# Patient Record
Sex: Female | Born: 1965 | Race: White | Hispanic: No | Marital: Married | Smoking: Never smoker
Health system: Southern US, Community
[De-identification: ages and names within clinical notes are randomized; demographics above are authoritative.]

## PROBLEM LIST (undated history)

## (undated) DIAGNOSIS — E079 Disorder of thyroid, unspecified: Secondary | ICD-10-CM

---

## 1997-12-16 ENCOUNTER — Other Ambulatory Visit: Admission: RE | Admit: 1997-12-16 | Discharge: 1997-12-16 | Payer: Self-pay | Admitting: Obstetrics & Gynecology

## 1998-01-05 ENCOUNTER — Inpatient Hospital Stay (HOSPITAL_COMMUNITY): Admission: AD | Admit: 1998-01-05 | Discharge: 1998-01-07 | Payer: Self-pay | Admitting: Obstetrics and Gynecology

## 1998-07-16 ENCOUNTER — Other Ambulatory Visit: Admission: RE | Admit: 1998-07-16 | Discharge: 1998-07-16 | Payer: Self-pay | Admitting: Obstetrics and Gynecology

## 1999-07-28 ENCOUNTER — Other Ambulatory Visit: Admission: RE | Admit: 1999-07-28 | Discharge: 1999-07-28 | Payer: Self-pay | Admitting: Obstetrics and Gynecology

## 2000-07-28 ENCOUNTER — Other Ambulatory Visit: Admission: RE | Admit: 2000-07-28 | Discharge: 2000-07-28 | Payer: Self-pay | Admitting: Obstetrics and Gynecology

## 2001-08-03 ENCOUNTER — Other Ambulatory Visit: Admission: RE | Admit: 2001-08-03 | Discharge: 2001-08-03 | Payer: Self-pay | Admitting: Obstetrics & Gynecology

## 2002-12-26 ENCOUNTER — Other Ambulatory Visit: Admission: RE | Admit: 2002-12-26 | Discharge: 2002-12-26 | Payer: Self-pay | Admitting: Obstetrics and Gynecology

## 2003-12-31 ENCOUNTER — Other Ambulatory Visit: Admission: RE | Admit: 2003-12-31 | Discharge: 2003-12-31 | Payer: Self-pay | Admitting: Obstetrics and Gynecology

## 2005-01-20 ENCOUNTER — Other Ambulatory Visit: Admission: RE | Admit: 2005-01-20 | Discharge: 2005-01-20 | Payer: Self-pay | Admitting: Obstetrics and Gynecology

## 2005-09-26 ENCOUNTER — Ambulatory Visit: Payer: Self-pay | Admitting: Family Medicine

## 2005-09-30 ENCOUNTER — Ambulatory Visit: Payer: Self-pay | Admitting: Family Medicine

## 2006-05-17 ENCOUNTER — Ambulatory Visit: Payer: Self-pay | Admitting: Family Medicine

## 2006-06-07 ENCOUNTER — Ambulatory Visit (HOSPITAL_COMMUNITY): Admission: RE | Admit: 2006-06-07 | Discharge: 2006-06-08 | Payer: Self-pay | Admitting: Obstetrics and Gynecology

## 2015-05-24 ENCOUNTER — Emergency Department (INDEPENDENT_AMBULATORY_CARE_PROVIDER_SITE_OTHER): Payer: Worker's Compensation

## 2015-05-24 ENCOUNTER — Encounter (HOSPITAL_COMMUNITY): Payer: Self-pay | Admitting: *Deleted

## 2015-05-24 ENCOUNTER — Emergency Department (INDEPENDENT_AMBULATORY_CARE_PROVIDER_SITE_OTHER)
Admission: EM | Admit: 2015-05-24 | Discharge: 2015-05-24 | Disposition: A | Discharge status: Home / Self Care | Payer: Worker's Compensation | Source: Home / Self Care | Attending: Emergency Medicine | Admitting: Emergency Medicine

## 2015-05-24 DIAGNOSIS — S82891A Other fracture of right lower leg, initial encounter for closed fracture: Secondary | ICD-10-CM

## 2015-05-24 HISTORY — DX: Disorder of thyroid, unspecified: E07.9

## 2015-05-24 MED ORDER — HYDROCODONE-ACETAMINOPHEN 5-325 MG PO TABS: 1.0000 | ORAL_TABLET | Freq: Four times a day (QID) | ORAL | Status: AC | PRN

## 2015-05-24 NOTE — Progress Notes (Signed)
Orthopedic Tech Progress Note Patient Details:  Penny Shaw 08-24-1965 [id number]  Ortho Devices Type of Ortho Device: Ace wrap, Post (short leg) splint, Stirrup splint Ortho Device/Splint Location: RLE Ortho Device/Splint Interventions: Ordered, Application   Jennye MoccasinHughes, Penny Shaw 05/24/2015, 5:12 PM

## 2015-05-24 NOTE — Discharge Instructions (Signed)
You have a broken ankle. This may need to be fixed surgically. Please call [facility] first thing in the morning. They should have an appointment for you to see either Dr. Zachery DauerBarnes or Dr. Victorino DikeHewitt. Leave the splint on until you see the orthopedic doctor. No weightbearing. Keep your foot elevated as much as possible. Apply ice as often as you can. You can take 600 mg of ibuprofen every 6 hours as needed for pain. I have also provided a prescription for Vicodin to use as needed for pain.

## 2015-05-24 NOTE — ED Provider Notes (Signed)
CSN: [id number]     Arrival date & time 05/24/15  1439 History   First MD Initiated Contact with Patient 05/24/15 1551     Chief Complaint  Patient presents with  . Ankle Pain   (Consider location/radiation/quality/duration/timing/severity/associated sxs/prior Treatment) HPI She is a 49 year old woman here for evaluation of right ankle injury. She states she was attempting to milk a cow this afternoon around 1pm when they cow stepped on her lateral ankle. She states she was able to hobble up to the house, but has not been able to bear weight since. She reports pain and swelling around the ankle. Initially she had some discomfort over the dorsal foot, but this has resolved. She has taken some Advil and applied ice.  Past Medical History  Diagnosis Date  . Thyroid disease    History reviewed. No pertinent past surgical history. History reviewed. No pertinent family history. Social History  Substance Use Topics  . Smoking status: Never Smoker   . Smokeless tobacco: None  . Alcohol Use: Yes   OB History    No data available     Review of Systems As in history of present illness Allergies  Sulfur  Home Medications   Prior to Admission medications   Medication Sig Start Date End Date Taking? Authorizing Provider  Levothyroxine Sodium (SYNTHROID PO) Take by mouth.   Yes Historical Provider, MD  HYDROcodone-acetaminophen (NORCO) 5-325 MG tablet Take 1 tablet by mouth every 6 (six) hours as needed for moderate pain. 05/24/15   Charm RingsErin J Doyle Tegethoff, MD   Meds Ordered and Administered this Visit  Medications - No data to display  BP 152/90 mmHg  Pulse 73  Temp(Src) 98.8 F (37.1 C) (Oral)  SpO2 97% No data found.   Physical Exam  Constitutional: She is oriented to person, place, and time. She appears well-developed and well-nourished. No distress.  Cardiovascular: Normal rate.   Pulmonary/Chest: Effort normal.  Musculoskeletal:  Right ankle: No significant swelling on both the  lateral and medial ankle. She is tender over both malleoli. No swelling in the foot or forefoot tenderness. She has good range of motion of her toes. Testing of ankle range of motion limited due to pain. 2+ DP pulse.  Neurological: She is alert and oriented to person, place, and time.    ED Course  Procedures (including critical care time)  Labs Review Labs Reviewed - No data to display  Imaging Review No results found.     MDM   1. Closed right ankle fracture, initial encounter    Spoke with Dr. Shon BatonBrooks, [facility].  Will place patient in a posterior leg splint with stirrups. She has crutches. Discussed no weightbearing, elevation, and ice. Ibuprofen as needed for pain. Prescription given for Norco to use as needed. [facility] with Dr. Victorino DikeHewitt. She is to call tomorrow morning to get the time.    Charm RingsErin J Yaw Escoto, MD 05/24/15 [phone]

## 2015-05-24 NOTE — ED Notes (Signed)
Pt  Has  Her  Own  crutches

## 2015-05-24 NOTE — ED Notes (Signed)
Pt  Reports  She  Was  Kicked    By  A    Cow     On her  r  Ankle        She         Reports      That              She   Has    Pain      In  The  Affected   Ankle         Especially  On  Weight bearing

## 2018-06-21 DIAGNOSIS — Z6830 Body mass index (BMI) 30.0-30.9, adult: Secondary | ICD-10-CM | POA: Diagnosis not present

## 2018-06-21 DIAGNOSIS — Z01419 Encounter for gynecological examination (general) (routine) without abnormal findings: Secondary | ICD-10-CM | POA: Diagnosis not present

## 2018-06-21 DIAGNOSIS — Z1231 Encounter for screening mammogram for malignant neoplasm of breast: Secondary | ICD-10-CM | POA: Diagnosis not present

## 2019-02-06 ENCOUNTER — Other Ambulatory Visit: Payer: Self-pay

## 2019-02-06 DIAGNOSIS — Z20822 Contact with and (suspected) exposure to covid-19: Secondary | ICD-10-CM

## 2019-02-07 LAB — NOVEL CORONAVIRUS, NAA: SARS-CoV-2, NAA: DETECTED — AB

## 2019-04-18 DIAGNOSIS — E039 Hypothyroidism, unspecified: Secondary | ICD-10-CM | POA: Diagnosis not present

## 2019-04-18 DIAGNOSIS — E782 Mixed hyperlipidemia: Secondary | ICD-10-CM | POA: Diagnosis not present

## 2019-04-23 DIAGNOSIS — Z23 Encounter for immunization: Secondary | ICD-10-CM | POA: Diagnosis not present

## 2019-04-23 DIAGNOSIS — Z0000 Encounter for general adult medical examination without abnormal findings: Secondary | ICD-10-CM | POA: Diagnosis not present

## 2019-04-23 DIAGNOSIS — E782 Mixed hyperlipidemia: Secondary | ICD-10-CM | POA: Diagnosis not present

## 2019-04-23 DIAGNOSIS — Z0001 Encounter for general adult medical examination with abnormal findings: Secondary | ICD-10-CM | POA: Diagnosis not present

## 2019-04-23 DIAGNOSIS — E039 Hypothyroidism, unspecified: Secondary | ICD-10-CM | POA: Diagnosis not present

## 2019-06-13 DIAGNOSIS — H9202 Otalgia, left ear: Secondary | ICD-10-CM | POA: Diagnosis not present

## 2019-06-13 DIAGNOSIS — H60392 Other infective otitis externa, left ear: Secondary | ICD-10-CM | POA: Diagnosis not present

## 2019-06-24 DIAGNOSIS — H9121 Sudden idiopathic hearing loss, right ear: Secondary | ICD-10-CM | POA: Diagnosis not present

## 2019-06-24 DIAGNOSIS — H9319 Tinnitus, unspecified ear: Secondary | ICD-10-CM | POA: Diagnosis not present

## 2019-06-24 DIAGNOSIS — H9311 Tinnitus, right ear: Secondary | ICD-10-CM | POA: Diagnosis not present

## 2019-06-24 DIAGNOSIS — R42 Dizziness and giddiness: Secondary | ICD-10-CM | POA: Diagnosis not present

## 2019-06-24 DIAGNOSIS — H6981 Other specified disorders of Eustachian tube, right ear: Secondary | ICD-10-CM | POA: Diagnosis not present

## 2019-07-08 ENCOUNTER — Other Ambulatory Visit: Payer: Self-pay | Admitting: Otolaryngology

## 2019-07-08 DIAGNOSIS — H6981 Other specified disorders of Eustachian tube, right ear: Secondary | ICD-10-CM | POA: Diagnosis not present

## 2019-07-08 DIAGNOSIS — H9041 Sensorineural hearing loss, unilateral, right ear, with unrestricted hearing on the contralateral side: Secondary | ICD-10-CM | POA: Diagnosis not present

## 2019-07-12 DIAGNOSIS — H9041 Sensorineural hearing loss, unilateral, right ear, with unrestricted hearing on the contralateral side: Secondary | ICD-10-CM | POA: Diagnosis not present

## 2019-07-12 DIAGNOSIS — Z135 Encounter for screening for eye and ear disorders: Secondary | ICD-10-CM | POA: Diagnosis not present

## 2019-07-16 ENCOUNTER — Ambulatory Visit: Payer: Self-pay

## 2019-08-19 DIAGNOSIS — Z01419 Encounter for gynecological examination (general) (routine) without abnormal findings: Secondary | ICD-10-CM | POA: Diagnosis not present

## 2019-08-19 DIAGNOSIS — Z1231 Encounter for screening mammogram for malignant neoplasm of breast: Secondary | ICD-10-CM | POA: Diagnosis not present

## 2019-08-19 DIAGNOSIS — Z124 Encounter for screening for malignant neoplasm of cervix: Secondary | ICD-10-CM | POA: Diagnosis not present

## 2019-08-19 DIAGNOSIS — Z6827 Body mass index (BMI) 27.0-27.9, adult: Secondary | ICD-10-CM | POA: Diagnosis not present

## 2020-01-09 DIAGNOSIS — H6981 Other specified disorders of Eustachian tube, right ear: Secondary | ICD-10-CM | POA: Diagnosis not present

## 2020-01-09 DIAGNOSIS — H9311 Tinnitus, right ear: Secondary | ICD-10-CM | POA: Diagnosis not present

## 2020-01-09 DIAGNOSIS — H6980 Other specified disorders of Eustachian tube, unspecified ear: Secondary | ICD-10-CM | POA: Diagnosis not present

## 2020-01-09 DIAGNOSIS — H9041 Sensorineural hearing loss, unilateral, right ear, with unrestricted hearing on the contralateral side: Secondary | ICD-10-CM | POA: Diagnosis not present

## 2020-01-09 DIAGNOSIS — J301 Allergic rhinitis due to pollen: Secondary | ICD-10-CM | POA: Diagnosis not present

## 2020-01-16 DIAGNOSIS — D1801 Hemangioma of skin and subcutaneous tissue: Secondary | ICD-10-CM | POA: Diagnosis not present

## 2020-01-16 DIAGNOSIS — D0461 Carcinoma in situ of skin of right upper limb, including shoulder: Secondary | ICD-10-CM | POA: Diagnosis not present

## 2020-01-16 DIAGNOSIS — L821 Other seborrheic keratosis: Secondary | ICD-10-CM | POA: Diagnosis not present

## 2020-01-16 DIAGNOSIS — L570 Actinic keratosis: Secondary | ICD-10-CM | POA: Diagnosis not present

## 2020-02-18 ENCOUNTER — Other Ambulatory Visit (HOSPITAL_COMMUNITY): Payer: Self-pay | Admitting: Internal Medicine

## 2020-02-18 ENCOUNTER — Ambulatory Visit (HOSPITAL_COMMUNITY)
Admission: RE | Admit: 2020-02-18 | Discharge: 2020-02-18 | Disposition: A | Discharge status: Home / Self Care | Payer: BC Managed Care – PPO | Source: Ambulatory Visit | Attending: Internal Medicine | Admitting: Internal Medicine

## 2020-02-18 ENCOUNTER — Other Ambulatory Visit: Payer: Self-pay

## 2020-02-18 DIAGNOSIS — M79605 Pain in left leg: Secondary | ICD-10-CM

## 2020-02-18 DIAGNOSIS — M79662 Pain in left lower leg: Secondary | ICD-10-CM | POA: Diagnosis not present

## 2020-02-26 DIAGNOSIS — R202 Paresthesia of skin: Secondary | ICD-10-CM | POA: Diagnosis not present

## 2020-02-26 DIAGNOSIS — L237 Allergic contact dermatitis due to plants, except food: Secondary | ICD-10-CM | POA: Diagnosis not present

## 2020-02-26 DIAGNOSIS — M25552 Pain in left hip: Secondary | ICD-10-CM | POA: Diagnosis not present

## 2020-03-02 DIAGNOSIS — M5442 Lumbago with sciatica, left side: Secondary | ICD-10-CM | POA: Diagnosis not present

## 2020-03-02 DIAGNOSIS — M9905 Segmental and somatic dysfunction of pelvic region: Secondary | ICD-10-CM | POA: Diagnosis not present

## 2020-03-02 DIAGNOSIS — M9903 Segmental and somatic dysfunction of lumbar region: Secondary | ICD-10-CM | POA: Diagnosis not present

## 2020-03-02 DIAGNOSIS — M9902 Segmental and somatic dysfunction of thoracic region: Secondary | ICD-10-CM | POA: Diagnosis not present

## 2020-03-06 DIAGNOSIS — M9905 Segmental and somatic dysfunction of pelvic region: Secondary | ICD-10-CM | POA: Diagnosis not present

## 2020-03-06 DIAGNOSIS — M9903 Segmental and somatic dysfunction of lumbar region: Secondary | ICD-10-CM | POA: Diagnosis not present

## 2020-03-06 DIAGNOSIS — M9902 Segmental and somatic dysfunction of thoracic region: Secondary | ICD-10-CM | POA: Diagnosis not present

## 2020-03-06 DIAGNOSIS — M5442 Lumbago with sciatica, left side: Secondary | ICD-10-CM | POA: Diagnosis not present

## 2020-03-09 DIAGNOSIS — M5442 Lumbago with sciatica, left side: Secondary | ICD-10-CM | POA: Diagnosis not present

## 2020-03-09 DIAGNOSIS — M9902 Segmental and somatic dysfunction of thoracic region: Secondary | ICD-10-CM | POA: Diagnosis not present

## 2020-03-09 DIAGNOSIS — M9905 Segmental and somatic dysfunction of pelvic region: Secondary | ICD-10-CM | POA: Diagnosis not present

## 2020-03-09 DIAGNOSIS — M9903 Segmental and somatic dysfunction of lumbar region: Secondary | ICD-10-CM | POA: Diagnosis not present

## 2020-03-13 DIAGNOSIS — M9902 Segmental and somatic dysfunction of thoracic region: Secondary | ICD-10-CM | POA: Diagnosis not present

## 2020-03-13 DIAGNOSIS — M9905 Segmental and somatic dysfunction of pelvic region: Secondary | ICD-10-CM | POA: Diagnosis not present

## 2020-03-13 DIAGNOSIS — M5442 Lumbago with sciatica, left side: Secondary | ICD-10-CM | POA: Diagnosis not present

## 2020-03-13 DIAGNOSIS — M9903 Segmental and somatic dysfunction of lumbar region: Secondary | ICD-10-CM | POA: Diagnosis not present

## 2020-03-16 DIAGNOSIS — M5442 Lumbago with sciatica, left side: Secondary | ICD-10-CM | POA: Diagnosis not present

## 2020-03-16 DIAGNOSIS — M9903 Segmental and somatic dysfunction of lumbar region: Secondary | ICD-10-CM | POA: Diagnosis not present

## 2020-03-16 DIAGNOSIS — M9902 Segmental and somatic dysfunction of thoracic region: Secondary | ICD-10-CM | POA: Diagnosis not present

## 2020-03-16 DIAGNOSIS — M9905 Segmental and somatic dysfunction of pelvic region: Secondary | ICD-10-CM | POA: Diagnosis not present

## 2020-03-20 DIAGNOSIS — M9902 Segmental and somatic dysfunction of thoracic region: Secondary | ICD-10-CM | POA: Diagnosis not present

## 2020-03-20 DIAGNOSIS — M9903 Segmental and somatic dysfunction of lumbar region: Secondary | ICD-10-CM | POA: Diagnosis not present

## 2020-03-20 DIAGNOSIS — M9905 Segmental and somatic dysfunction of pelvic region: Secondary | ICD-10-CM | POA: Diagnosis not present

## 2020-03-20 DIAGNOSIS — M5442 Lumbago with sciatica, left side: Secondary | ICD-10-CM | POA: Diagnosis not present

## 2020-03-27 DIAGNOSIS — M9905 Segmental and somatic dysfunction of pelvic region: Secondary | ICD-10-CM | POA: Diagnosis not present

## 2020-03-27 DIAGNOSIS — M9902 Segmental and somatic dysfunction of thoracic region: Secondary | ICD-10-CM | POA: Diagnosis not present

## 2020-03-27 DIAGNOSIS — M5442 Lumbago with sciatica, left side: Secondary | ICD-10-CM | POA: Diagnosis not present

## 2020-03-27 DIAGNOSIS — M9903 Segmental and somatic dysfunction of lumbar region: Secondary | ICD-10-CM | POA: Diagnosis not present

## 2020-04-06 DIAGNOSIS — M9903 Segmental and somatic dysfunction of lumbar region: Secondary | ICD-10-CM | POA: Diagnosis not present

## 2020-04-06 DIAGNOSIS — M9905 Segmental and somatic dysfunction of pelvic region: Secondary | ICD-10-CM | POA: Diagnosis not present

## 2020-04-06 DIAGNOSIS — M9902 Segmental and somatic dysfunction of thoracic region: Secondary | ICD-10-CM | POA: Diagnosis not present

## 2020-04-06 DIAGNOSIS — M5442 Lumbago with sciatica, left side: Secondary | ICD-10-CM | POA: Diagnosis not present

## 2020-04-13 DIAGNOSIS — H40003 Preglaucoma, unspecified, bilateral: Secondary | ICD-10-CM | POA: Diagnosis not present

## 2020-04-30 DIAGNOSIS — E782 Mixed hyperlipidemia: Secondary | ICD-10-CM | POA: Diagnosis not present

## 2020-04-30 DIAGNOSIS — Z23 Encounter for immunization: Secondary | ICD-10-CM | POA: Diagnosis not present

## 2020-04-30 DIAGNOSIS — L237 Allergic contact dermatitis due to plants, except food: Secondary | ICD-10-CM | POA: Diagnosis not present

## 2020-04-30 DIAGNOSIS — E039 Hypothyroidism, unspecified: Secondary | ICD-10-CM | POA: Diagnosis not present

## 2020-04-30 DIAGNOSIS — M25552 Pain in left hip: Secondary | ICD-10-CM | POA: Diagnosis not present

## 2020-04-30 DIAGNOSIS — R202 Paresthesia of skin: Secondary | ICD-10-CM | POA: Diagnosis not present

## 2020-04-30 DIAGNOSIS — M7918 Myalgia, other site: Secondary | ICD-10-CM | POA: Diagnosis not present

## 2020-05-05 DIAGNOSIS — Z23 Encounter for immunization: Secondary | ICD-10-CM | POA: Diagnosis not present

## 2020-05-05 DIAGNOSIS — Z0001 Encounter for general adult medical examination with abnormal findings: Secondary | ICD-10-CM | POA: Diagnosis not present

## 2020-05-15 ENCOUNTER — Ambulatory Visit: Payer: Self-pay | Admitting: Neurology

## 2020-08-21 DIAGNOSIS — Z01419 Encounter for gynecological examination (general) (routine) without abnormal findings: Secondary | ICD-10-CM | POA: Diagnosis not present

## 2020-08-21 DIAGNOSIS — Z1231 Encounter for screening mammogram for malignant neoplasm of breast: Secondary | ICD-10-CM | POA: Diagnosis not present

## 2021-01-29 DIAGNOSIS — H9011 Conductive hearing loss, unilateral, right ear, with unrestricted hearing on the contralateral side: Secondary | ICD-10-CM | POA: Diagnosis not present

## 2021-01-29 DIAGNOSIS — H6981 Other specified disorders of Eustachian tube, right ear: Secondary | ICD-10-CM | POA: Diagnosis not present

## 2021-05-11 DIAGNOSIS — Z008 Encounter for other general examination: Secondary | ICD-10-CM | POA: Diagnosis not present

## 2021-05-11 DIAGNOSIS — E039 Hypothyroidism, unspecified: Secondary | ICD-10-CM | POA: Diagnosis not present

## 2021-05-19 DIAGNOSIS — M7062 Trochanteric bursitis, left hip: Secondary | ICD-10-CM | POA: Diagnosis not present

## 2021-08-24 DIAGNOSIS — Z1231 Encounter for screening mammogram for malignant neoplasm of breast: Secondary | ICD-10-CM | POA: Diagnosis not present

## 2021-08-24 DIAGNOSIS — Z01419 Encounter for gynecological examination (general) (routine) without abnormal findings: Secondary | ICD-10-CM | POA: Diagnosis not present

## 2021-08-31 DIAGNOSIS — I8312 Varicose veins of left lower extremity with inflammation: Secondary | ICD-10-CM | POA: Diagnosis not present

## 2021-08-31 DIAGNOSIS — I8311 Varicose veins of right lower extremity with inflammation: Secondary | ICD-10-CM | POA: Diagnosis not present

## 2022-01-12 DIAGNOSIS — H9202 Otalgia, left ear: Secondary | ICD-10-CM | POA: Diagnosis not present

## 2022-01-12 DIAGNOSIS — T162XXA Foreign body in left ear, initial encounter: Secondary | ICD-10-CM | POA: Diagnosis not present

## 2022-08-29 IMAGING — DX DG TIBIA/FIBULA 2V*L*
4 series · 4 of 4 positions shown · non-contrast
Comparison: None.

CLINICAL DATA: Pain in the left lower extremity

EXAM:
LEFT TIBIA AND FIBULA - 2 VIEW

[tibia ap (1 of 2)]
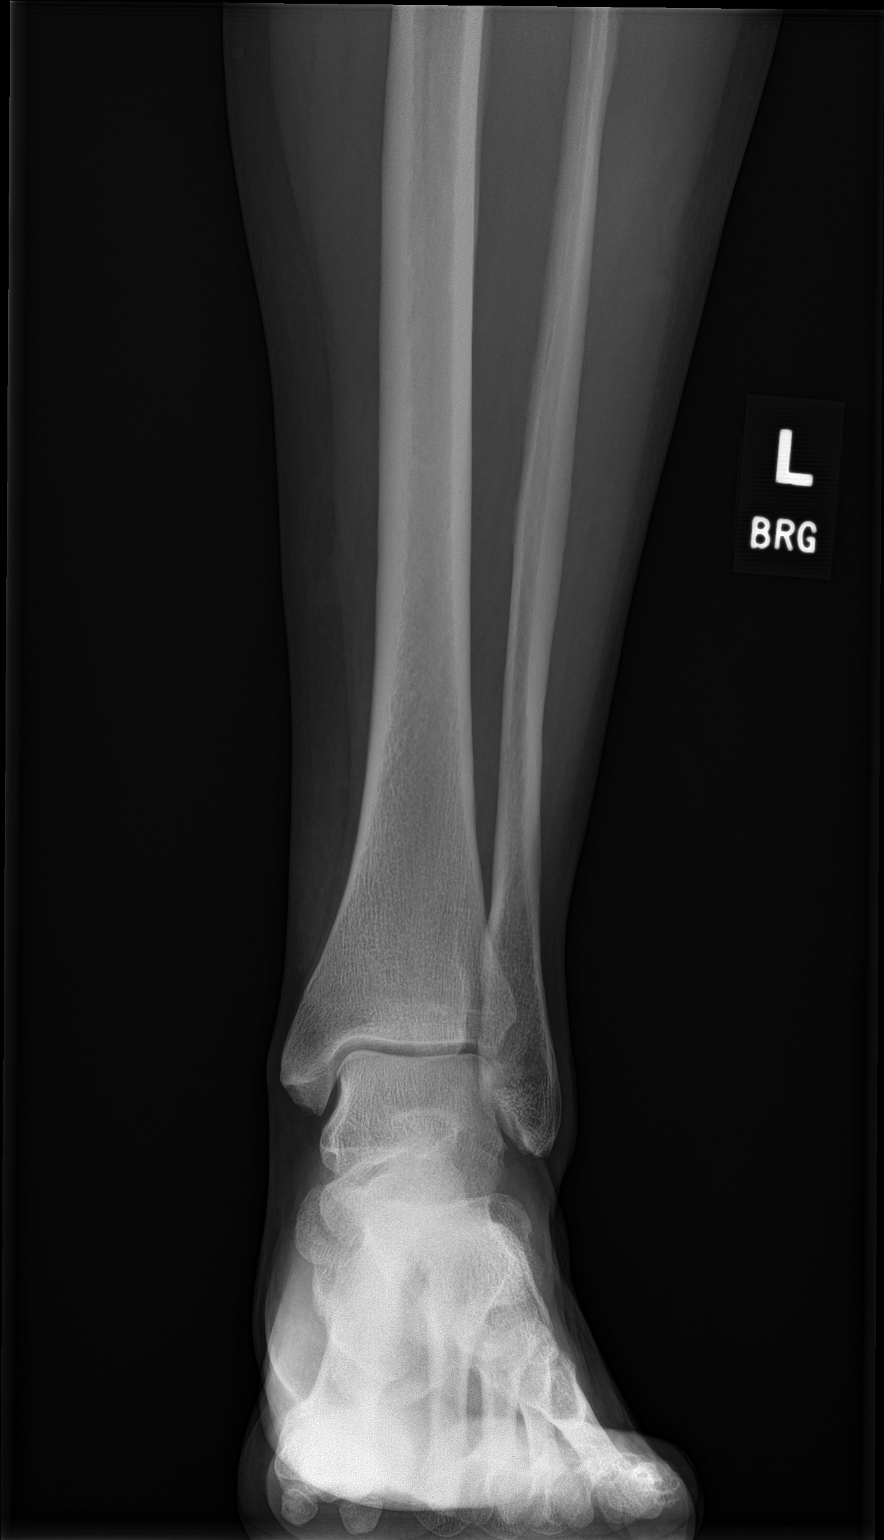

[tibia ap (2 of 2)]
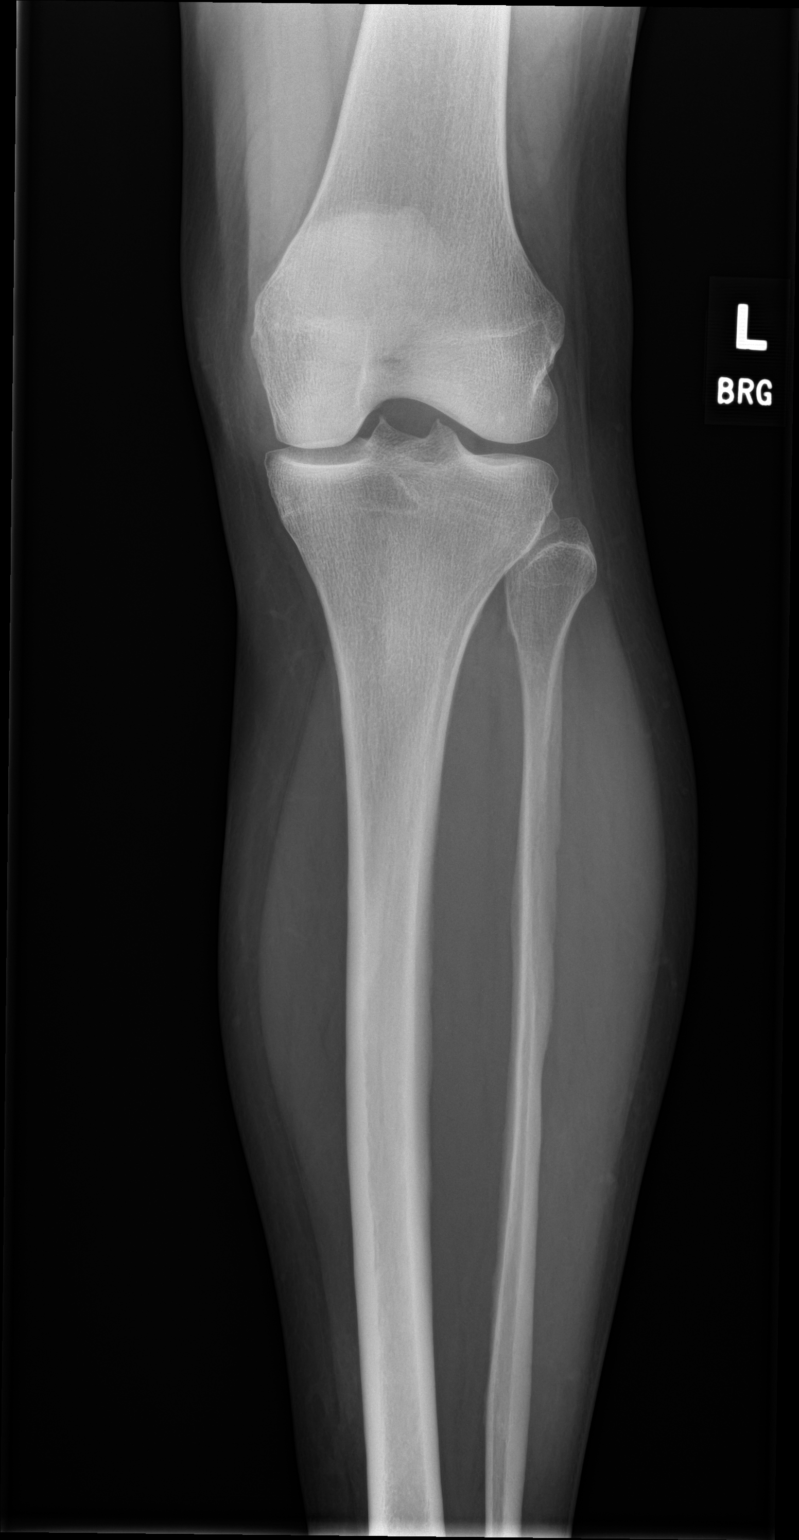

[tibia lat (1 of 2)]
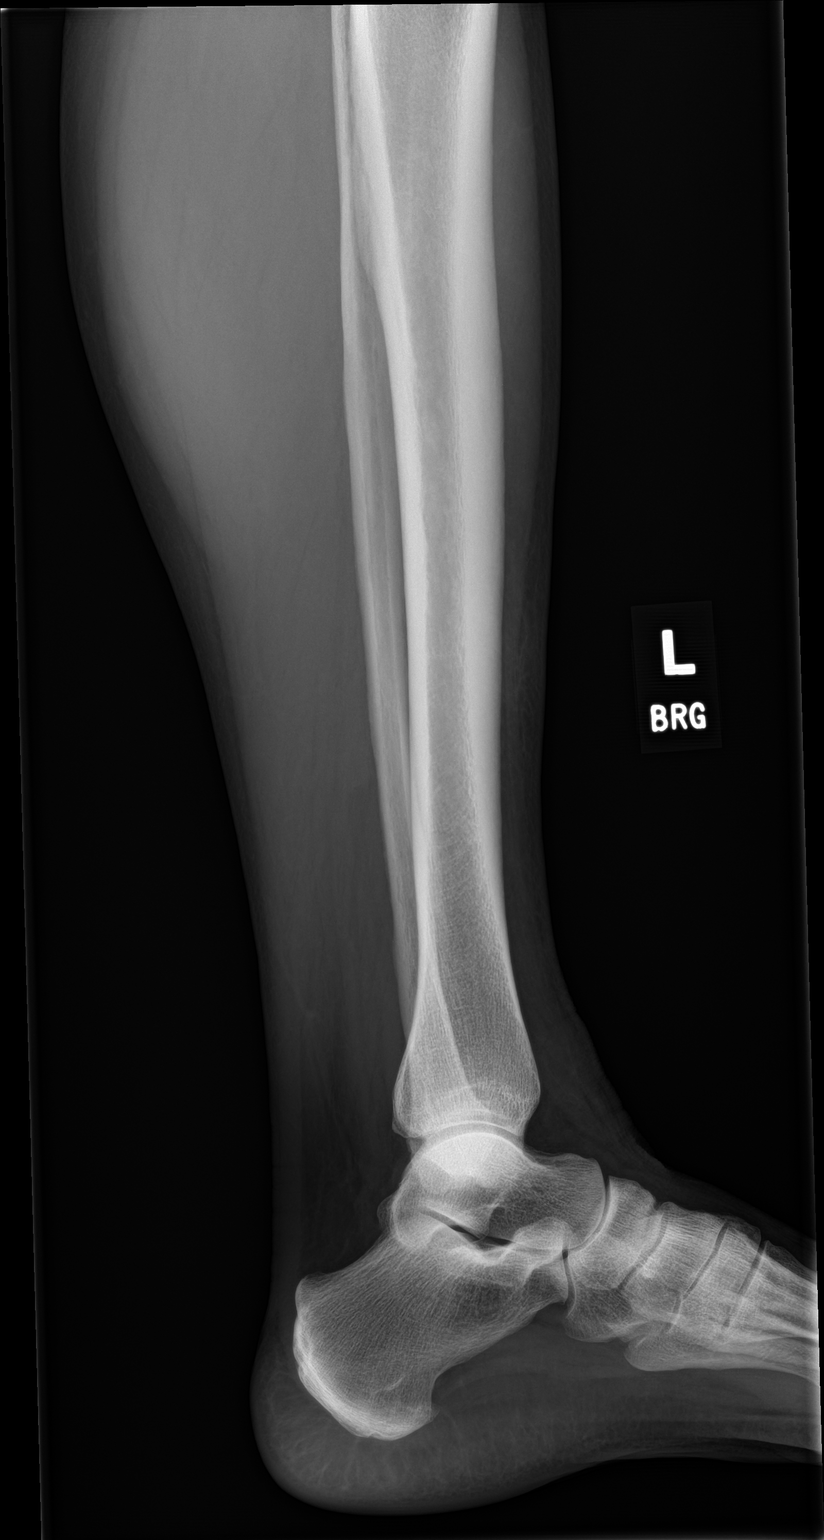

[tibia lat (2 of 2)]
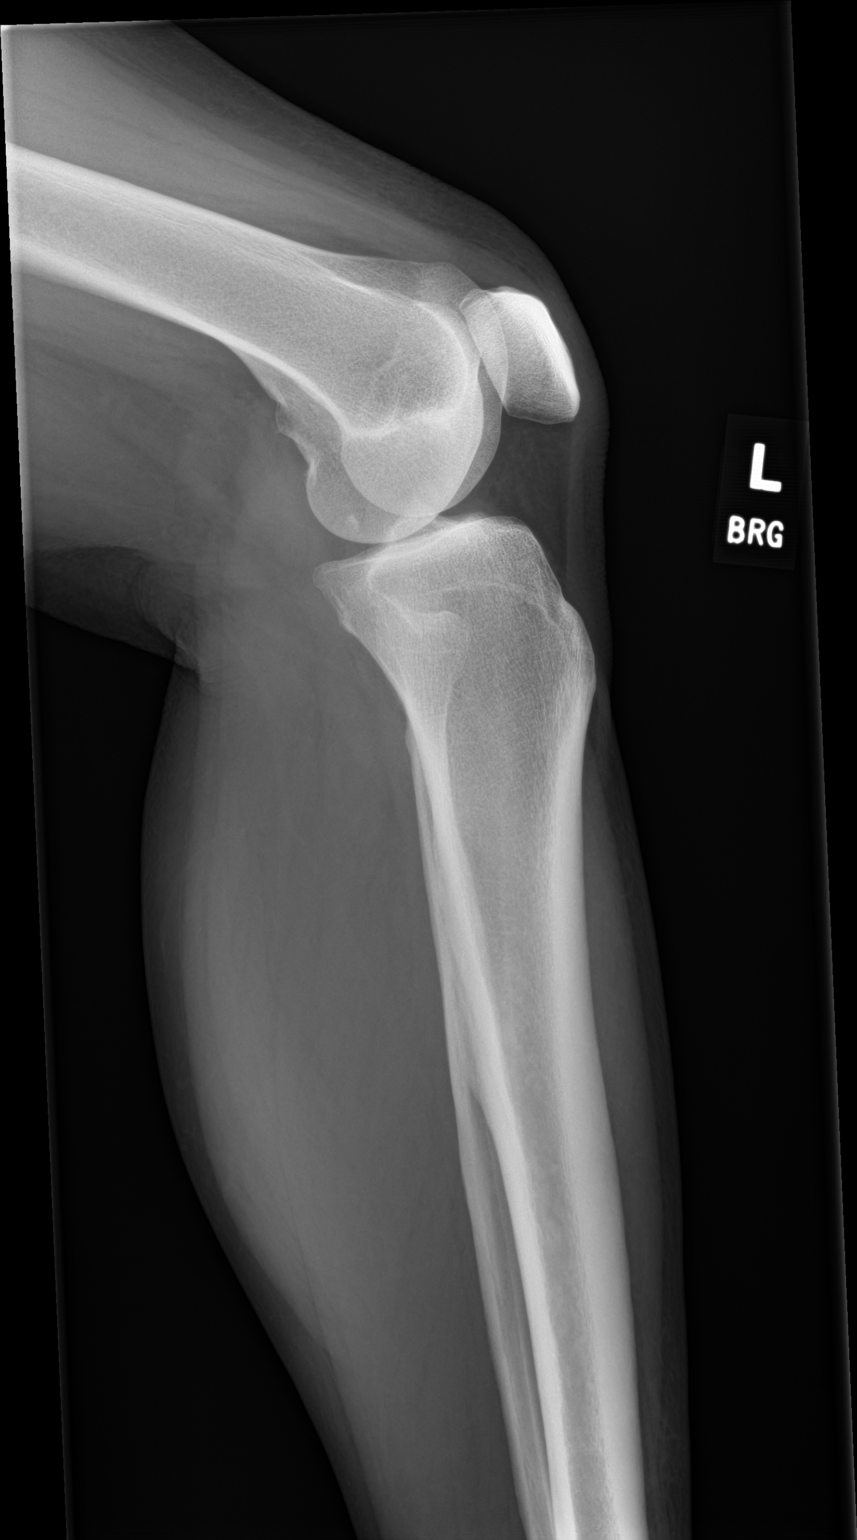

[4 of 4 positions shown; findings below may reference images not displayed]

FINDINGS: There is no evidence of fracture or other focal bone lesions. Soft
tissues are unremarkable.
IMPRESSION: Negative.
